# Patient Record
Sex: Female | Born: 1975 | Race: Black or African American | Hispanic: No | Marital: Single | Smoking: Current every day smoker
Health system: Southern US, Community
[De-identification: ages and names within clinical notes are randomized; demographics above are authoritative.]

## PROBLEM LIST (undated history)

## (undated) DIAGNOSIS — N200 Calculus of kidney: Secondary | ICD-10-CM

---

## 1998-11-10 ENCOUNTER — Encounter: Payer: Self-pay | Admitting: *Deleted

## 1998-11-10 ENCOUNTER — Ambulatory Visit (HOSPITAL_COMMUNITY): Admission: RE | Admit: 1998-11-10 | Discharge: 1998-11-10 | Payer: Self-pay | Admitting: *Deleted

## 1998-12-04 ENCOUNTER — Emergency Department (HOSPITAL_COMMUNITY): Admission: EM | Admit: 1998-12-04 | Discharge: 1998-12-04 | Payer: Self-pay | Admitting: Emergency Medicine

## 1998-12-08 ENCOUNTER — Inpatient Hospital Stay (HOSPITAL_COMMUNITY): Admission: AD | Admit: 1998-12-08 | Discharge: 1998-12-08 | Payer: Self-pay | Admitting: *Deleted

## 1999-02-10 ENCOUNTER — Inpatient Hospital Stay (HOSPITAL_COMMUNITY): Admission: AD | Admit: 1999-02-10 | Discharge: 1999-02-12 | Payer: Self-pay | Admitting: *Deleted

## 2003-03-24 ENCOUNTER — Emergency Department (HOSPITAL_COMMUNITY): Admission: EM | Admit: 2003-03-24 | Discharge: 2003-03-24 | Payer: Self-pay | Admitting: Emergency Medicine

## 2003-03-24 ENCOUNTER — Encounter: Payer: Self-pay | Admitting: Emergency Medicine

## 2011-07-21 ENCOUNTER — Ambulatory Visit (INDEPENDENT_AMBULATORY_CARE_PROVIDER_SITE_OTHER): Payer: Managed Care, Other (non HMO)

## 2011-07-21 DIAGNOSIS — K089 Disorder of teeth and supporting structures, unspecified: Secondary | ICD-10-CM

## 2011-07-21 DIAGNOSIS — Z0000 Encounter for general adult medical examination without abnormal findings: Secondary | ICD-10-CM

## 2011-12-31 ENCOUNTER — Emergency Department (HOSPITAL_COMMUNITY)
Admission: EM | Admit: 2011-12-31 | Discharge: 2012-01-01 | Disposition: A | Discharge status: Home / Self Care | Payer: Managed Care, Other (non HMO) | Attending: Emergency Medicine | Admitting: Emergency Medicine

## 2011-12-31 ENCOUNTER — Encounter (HOSPITAL_COMMUNITY): Payer: Self-pay | Admitting: *Deleted

## 2011-12-31 DIAGNOSIS — R109 Unspecified abdominal pain: Secondary | ICD-10-CM | POA: Insufficient documentation

## 2011-12-31 DIAGNOSIS — N200 Calculus of kidney: Secondary | ICD-10-CM

## 2011-12-31 DIAGNOSIS — N201 Calculus of ureter: Secondary | ICD-10-CM | POA: Insufficient documentation

## 2011-12-31 NOTE — ED Notes (Signed)
Pt states she has right lower quadrant pain   10/10  States it woke her up at 4 am   3 days ago,  Had a bm that was loose and today her bm was normal for her.  Denies fever or any known injury

## 2011-12-31 NOTE — ED Notes (Signed)
Pt c/o pain to R side radiating to R flank onset 4 am Thursday morning. Urine sample obtained

## 2012-01-01 ENCOUNTER — Emergency Department (HOSPITAL_COMMUNITY): Payer: Managed Care, Other (non HMO)

## 2012-01-01 LAB — BASIC METABOLIC PANEL
BUN: 12 mg/dL (ref 6–23)
CO2: 23 mEq/L (ref 19–32)
Glucose, Bld: 116 mg/dL — ABNORMAL HIGH (ref 70–99)
Potassium: 3.9 mEq/L (ref 3.5–5.1)
Sodium: 134 mEq/L — ABNORMAL LOW (ref 135–145)

## 2012-01-01 LAB — URINALYSIS, ROUTINE W REFLEX MICROSCOPIC
Bilirubin Urine: NEGATIVE
Glucose, UA: NEGATIVE mg/dL
Ketones, ur: NEGATIVE mg/dL
Nitrite: NEGATIVE
Protein, ur: 30 mg/dL — AB
Specific Gravity, Urine: 1.02 (ref 1.005–1.030)
Urobilinogen, UA: 0.2 mg/dL (ref 0.0–1.0)
pH: 5 (ref 5.0–8.0)

## 2012-01-01 LAB — URINE MICROSCOPIC-ADD ON

## 2012-01-01 LAB — PREGNANCY, URINE: Preg Test, Ur: NEGATIVE

## 2012-01-01 LAB — CBC WITH DIFFERENTIAL/PLATELET
Hemoglobin: 12.3 g/dL (ref 12.0–15.0)
Lymphocytes Relative: 13 % (ref 12–46)
Lymphs Abs: 1.7 10*3/uL (ref 0.7–4.0)
MCV: 87.3 fL (ref 78.0–100.0)
Monocytes Relative: 4 % (ref 3–12)
Neutrophils Relative %: 83 % — ABNORMAL HIGH (ref 43–77)
Platelets: 274 10*3/uL (ref 150–400)
RBC: 4.26 MIL/uL (ref 3.87–5.11)
WBC: 13.8 10*3/uL — ABNORMAL HIGH (ref 4.0–10.5)

## 2012-01-01 MED ORDER — HYDROCODONE-ACETAMINOPHEN 5-500 MG PO TABS: 1.0000 | ORAL_TABLET | Freq: Four times a day (QID) | ORAL | Status: AC | PRN

## 2012-01-01 MED ORDER — SODIUM CHLORIDE 0.9 % IV BOLUS (SEPSIS)
1000.0000 mL | Freq: Once | INTRAVENOUS | Status: AC
  Administered 2012-01-01: 1000 mL via INTRAVENOUS

## 2012-01-01 MED ORDER — KETOROLAC TROMETHAMINE 30 MG/ML IJ SOLN
30.0000 mg | Freq: Once | INTRAMUSCULAR | Status: AC
  Administered 2012-01-01: 30 mg via INTRAVENOUS
  Filled 2012-01-01: qty 1

## 2012-01-01 MED ORDER — TAMSULOSIN HCL 0.4 MG PO CAPS: 0.4000 mg | ORAL_CAPSULE | Freq: Two times a day (BID) | ORAL | Status: DC

## 2012-01-01 MED ORDER — HYDROMORPHONE HCL PF 1 MG/ML IJ SOLN
1.0000 mg | Freq: Once | INTRAMUSCULAR | Status: AC
  Administered 2012-01-01: 1 mg via INTRAVENOUS
  Filled 2012-01-01 (×2): qty 1

## 2012-01-01 MED ORDER — IBUPROFEN 800 MG PO TABS: 800.0000 mg | ORAL_TABLET | Freq: Three times a day (TID) | ORAL | Status: AC

## 2012-01-01 MED ORDER — ONDANSETRON HCL 4 MG/2ML IJ SOLN
4.0000 mg | Freq: Once | INTRAMUSCULAR | Status: AC
  Administered 2012-01-01: 4 mg via INTRAVENOUS
  Filled 2012-01-01: qty 2

## 2012-01-01 MED ORDER — PROMETHAZINE HCL 25 MG PO TABS: 25.0000 mg | ORAL_TABLET | Freq: Four times a day (QID) | ORAL | Status: DC | PRN

## 2012-01-01 NOTE — ED Notes (Signed)
Dr. Miller @ bedside.

## 2012-01-01 NOTE — ED Provider Notes (Signed)
History     CSN: [id number]  Arrival date & time 12/31/11  2300   First MD Initiated Contact with Patient 01/01/12 0057      Chief Complaint  Patient presents with  . Abdominal Pain    right lower quadrant    (Consider location/radiation/quality/duration/timing/severity/associated sxs/prior treatment) HPI Comments: 48 her old female with no abdominal surgical history presents with right-sided pain which has been ongoing intermittently for the last several days. This pain is currently 10 out of 10, severe, associated with nausea and hematuria. Nothing seems to make this better or worse, she has been unable to eat anything this evening because of the pain. It has been constant for the last 4 hours. It is poorly described quality. She has no history of kidney stones, denies fevers chills diarrhea, swelling, rashes.  Patient is a 36 y.o. female presenting with abdominal pain. The history is provided by the patient and a parent.  Abdominal Pain The primary symptoms of the illness include abdominal pain.    History reviewed. No pertinent past medical history.  History reviewed. No pertinent past surgical history.  History reviewed. No pertinent family history.  History  Substance Use Topics  . Smoking status: Not on file  . Smokeless tobacco: Not on file  . Alcohol Use: No    OB History    Grav Para Term Preterm Abortions TAB SAB Ect Mult Living                  Review of Systems  Gastrointestinal: Positive for abdominal pain.  All other systems reviewed and are negative.    Allergies  Review of patient's allergies indicates no known allergies.  Home Medications   Current Outpatient Rx  Name Route Sig Dispense Refill  . HYDROCODONE-ACETAMINOPHEN 5-500 MG PO TABS Oral Take 1-2 tablets by mouth every 6 (six) hours as needed for pain. 15 tablet 0  . IBUPROFEN 800 MG PO TABS Oral Take 1 tablet (800 mg total) by mouth 3 (three) times daily. 21 tablet 0  . PROMETHAZINE HCL  25 MG PO TABS Oral Take 1 tablet (25 mg total) by mouth every 6 (six) hours as needed for nausea. 12 tablet 0  . TAMSULOSIN HCL 0.4 MG PO CAPS Oral Take 1 capsule (0.4 mg total) by mouth 2 (two) times daily. 10 capsule 0    BP 136/70  Pulse 88  Temp 98.3 F (36.8 C) (Oral)  Resp 16  SpO2 99%  LMP 12/31/2011  Physical Exam  Nursing note and vitals reviewed. Constitutional: She appears well-developed and well-nourished. No distress.  HENT:  Head: Normocephalic and atraumatic.  Mouth/Throat: Oropharynx is clear and moist. No oropharyngeal exudate.  Eyes: Conjunctivae and EOM are normal. Pupils are equal, round, and reactive to light. Right eye exhibits no discharge. Left eye exhibits no discharge. No scleral icterus.  Neck: Normal range of motion. Neck supple. No JVD present. No thyromegaly present.  Cardiovascular: Normal rate, regular rhythm, normal heart sounds and intact distal pulses.  Exam reveals no gallop and no friction rub.   No murmur heard. Pulmonary/Chest: Effort normal and breath sounds normal. No respiratory distress. She has no wheezes. She has no rales.  Abdominal: Soft. Bowel sounds are normal. She exhibits no distension and no mass. There is no tenderness.       No CVA tenderness, no tenderness to palpation of the abdomen or the side  Musculoskeletal: Normal range of motion. She exhibits no edema and no tenderness.  Lymphadenopathy:  She has no cervical adenopathy.  Neurological: She is alert. Coordination normal.  Skin: Skin is warm and dry. No rash noted. No erythema.  Psychiatric: She has a normal mood and affect. Her behavior is normal.    ED Course  Procedures (including critical care time)  Labs Reviewed  URINALYSIS, ROUTINE W REFLEX MICROSCOPIC - Abnormal; Notable for the following:    APPearance TURBID (*)     Hgb urine dipstick LARGE (*)     Protein, ur 30 (*)     Leukocytes, UA SMALL (*)     All other components within normal limits  URINE  MICROSCOPIC-ADD ON - Abnormal; Notable for the following:    Bacteria, UA MANY (*)     Crystals CA OXALATE CRYSTALS (*)     All other components within normal limits  CBC WITH DIFFERENTIAL - Abnormal; Notable for the following:    WBC 13.8 (*)     Neutrophils Relative 83 (*)     Neutro Abs 11.5 (*)     All other components within normal limits  BASIC METABOLIC PANEL - Abnormal; Notable for the following:    Sodium 134 (*)     Glucose, Bld 116 (*)     GFR calc non Af Amer 77 (*)     GFR calc Af Amer 89 (*)     All other components within normal limits  PREGNANCY, URINE   Ct Abdomen Pelvis Wo Contrast  01/01/2012  *RADIOLOGY REPORT*  Clinical Data: Right-sided flank pain.  CT ABDOMEN AND PELVIS WITHOUT CONTRAST  Technique:  Multidetector CT imaging of the abdomen and pelvis was performed following the standard protocol without intravenous contrast.  Comparison: None.  Findings:  Renal:  There is a renal edema and hydronephrosis on the right. There is hydroureter on the right.  This secondary to an obstructing calculus within the mid right ureter measuring 4 mm (image 53).  This calculus is at the level of the L5-S1 disc space and is evident on the CT scout.  There is no nephrolithiasis.  No left ureterolithiasis.  Lung bases are clear.  No pericardial fluid.  No focal hepatic lesion.  The gallbladder, pancreas, spleen, adrenal glands are normal.  The stomach, small bowel, appendix, and colon are normal.  No free fluid the pelvis.  The uterus and ovaries and bladder normal.  Uterus is enlarged.  There is  an exophytic mass extending from the anterior margin of the uterine body which likely represents a serosal or intramural leiomyoma measuring 6.7 x 4.1 x 6.2 cm  IMPRESSION:  1.  Obstructing calculus within the mid right ureter with mild to moderate hydronephrosis on the right. 2.  Probable fibroid  uterus.  Original Report Authenticated By: Genevive Bi, M.D.     1. Kidney stone       MDM    Patient has symptoms that are consistent with a kidney stone or other internal problem. She has no reproducible tenderness, normal vital signs but the intermittent nature of her pain, calcium oxalate in her urine with hematuria suggest kidney stone, CT scan, renal function, pain medication nausea medicines and fluids.   CT scan confirms right-sided mid ureteral 4 mm urinary calculus, urinalysis shows hematuria, negative nitrites, renal function preserved, blood counts slightly elevated at 13,800. The signs were discussed with the patient and family members, she feels much better after medicines and is amenable to followup.  Discharge Prescriptions include:  Ibuprofen vicodin flomax Phenergan      Oris Drone  Hyacinth Meeker, MD 01/01/12 [phone]

## 2012-01-20 ENCOUNTER — Ambulatory Visit (INDEPENDENT_AMBULATORY_CARE_PROVIDER_SITE_OTHER): Payer: Managed Care, Other (non HMO) | Admitting: Physician Assistant

## 2012-01-20 VITALS — BP 138/78 | HR 107 | Temp 98.8°F | Resp 17 | Ht 66.0 in | Wt 181.0 lb

## 2012-01-20 DIAGNOSIS — R300 Dysuria: Secondary | ICD-10-CM

## 2012-01-20 DIAGNOSIS — R350 Frequency of micturition: Secondary | ICD-10-CM

## 2012-01-20 DIAGNOSIS — B9689 Other specified bacterial agents as the cause of diseases classified elsewhere: Secondary | ICD-10-CM

## 2012-01-20 DIAGNOSIS — N760 Acute vaginitis: Secondary | ICD-10-CM

## 2012-01-20 LAB — POCT UA - MICROSCOPIC ONLY

## 2012-01-20 LAB — POCT URINALYSIS DIPSTICK
Bilirubin, UA: NEGATIVE
Glucose, UA: NEGATIVE
Ketones, UA: NEGATIVE
Spec Grav, UA: 1.02

## 2012-01-20 LAB — POCT WET PREP WITH KOH
KOH Prep POC: NEGATIVE
Trichomonas, UA: NEGATIVE
Yeast Wet Prep HPF POC: NEGATIVE

## 2012-01-20 MED ORDER — METRONIDAZOLE 500 MG PO TABS: 500.0000 mg | ORAL_TABLET | Freq: Two times a day (BID) | ORAL | Status: AC

## 2012-01-20 NOTE — Progress Notes (Signed)
  Subjective:    Patient ID: Meghan Ross, female    DOB: 1975/07/20, 36 y.o.   MRN: [id number]  HPI  Pt presents to clinic with 2 day h/o urinary frequency and dysuria with abd pressure with urination.  She had a kidney stone earlier this month but this feels different.  She has hadno F/C and no back pain. She has no vaginal symptoms and no new sexual partners.  Review of Systems  Constitutional: Negative for fever and chills.  Gastrointestinal: Positive for abdominal pain (pressure ).  Genitourinary: Positive for dysuria, urgency, frequency and hematuria. Negative for flank pain, decreased urine volume and menstrual problem.  Musculoskeletal: Negative for back pain.       Objective:   Physical Exam  Constitutional: She is oriented to person, place, and time. She appears well-developed and well-nourished.  HENT:  Head: Normocephalic and atraumatic.  Right Ear: External ear normal.  Left Ear: External ear normal.  Nose: Nose normal.  Eyes: Conjunctivae are normal.  Neck: Normal range of motion.  Abdominal: Soft. Bowel sounds are normal. She exhibits no distension. There is tenderness (suprapubic). There is no guarding.       No CVA tenderness   Neurological: She is alert and oriented to person, place, and time.  Skin: Skin is warm and dry.  Psychiatric: She has a normal mood and affect. Her behavior is normal. Judgment and thought content normal.   Results for orders placed in visit on 01/20/12  POCT URINALYSIS DIPSTICK      Component Value Range   Color, UA yellow     Clarity, UA clear     Glucose, UA neg     Bilirubin, UA neg     Ketones, UA neg     Spec Grav, UA 1.020     Blood, UA moderate     pH, UA 5.5     Protein, UA neg     Urobilinogen, UA 0.2     Nitrite, UA neg     Leukocytes, UA Negative    POCT UA - MICROSCOPIC ONLY      Component Value Range   WBC, Ur, HPF, POC 1-4     RBC, urine, microscopic 4-6     Bacteria, U Microscopic small     Mucus, UA  trace     Epithelial cells, urine per micros 2-4     Crystals, Ur, HPF, POC neg     Casts, Ur, LPF, POC neg     Yeast, UA neg    POCT WET PREP WITH KOH      Component Value Range   Trichomonas, UA Negative     Clue Cells Wet Prep HPF POC 4-6     Epithelial Wet Prep HPF POC 8-10     Yeast Wet Prep HPF POC neg     Bacteria Wet Prep HPF POC 3+     RBC Wet Prep HPF POC 3-5     WBC Wet Prep HPF POC 12-18     KOH Prep POC Negative          Assessment & Plan:   1. Dysuria  POCT urinalysis dipstick, POCT UA - Microscopic Only, Urine culture  2. Frequency of urination  POCT urinalysis dipstick, POCT UA - Microscopic Only, POCT Wet Prep with KOH  3. BV (bacterial vaginosis)  metroNIDAZOLE (FLAGYL) 500 MG tablet   Pt to push fluids.  D/w pt diagnosis and next steps - she understands and agrees.

## 2012-01-22 LAB — URINE CULTURE

## 2012-03-05 ENCOUNTER — Ambulatory Visit (INDEPENDENT_AMBULATORY_CARE_PROVIDER_SITE_OTHER): Payer: Managed Care, Other (non HMO) | Admitting: Family Medicine

## 2012-03-05 ENCOUNTER — Ambulatory Visit: Payer: Managed Care, Other (non HMO)

## 2012-03-05 VITALS — BP 126/82 | HR 104 | Temp 98.5°F | Resp 14 | Ht 66.25 in | Wt 175.0 lb

## 2012-03-05 DIAGNOSIS — J029 Acute pharyngitis, unspecified: Secondary | ICD-10-CM

## 2012-03-05 MED ORDER — CEFDINIR 300 MG PO CAPS: 300.0000 mg | ORAL_CAPSULE | Freq: Two times a day (BID) | ORAL | Status: AC

## 2012-03-05 NOTE — Progress Notes (Signed)
[facility] [address], [place] [address] [phone]  Date:  03/05/2012   Name:  Meghan Ross   DOB:  1976/06/15   MRN:  [id number]  PCP:  No primary provider on file.    Chief Complaint: Sore Throat, Sinusitis and Cough   History of Present Illness:  Meghan Ross is a 36 y.o. very pleasant female patient who presents with the following:  She has noted a bad sore throat, sinus congestion and right ear pain.  She had a subjective fever and sweats at night.  She has a slight cough and chest congestion.  Illness for about 2 days. She has tried alka- seltzer cold but this has not helped.  Her throat seems swollen- hurts to eat and drink.  She has some nausea, but no vomiting or diarrhea.    Her 58 year old daughter is not sick  There is no problem list on file for this patient.   No past medical history on file.  No past surgical history on file.  History  Substance Use Topics  . Smoking status: Current Everyday Smoker -- 0.3 packs/day for 17 years    Types: Cigarettes  . Smokeless tobacco: Not on file  . Alcohol Use: No    No family history on file.  No Known Allergies  Medication list has been reviewed and updated.  No current outpatient prescriptions on file prior to visit.    Review of Systems:  As per HPI- otherwise negative.   Physical Examination: Filed Vitals:   03/05/12 0832  BP: 126/82  Pulse: 104  Temp: 98.5 F (36.9 C)  Resp: 14   Filed Vitals:   03/05/12 0832  Height: 5' 6.25" (1.683 m)  Weight: 175 lb (79.379 kg)   Body mass index is 28.03 kg/(m^2). Ideal Body Weight: Weight in (lb) to have BMI = 25: 155.7   GEN: WDWN, NAD, Non-toxic, A & O x 3 HEENT: Atraumatic, Normocephalic. Neck supple. No masses.  TM wnl, oropharynx with redness and slight swelling right side.  Uvula midline, no visible exudate.  Shotty cervical nodes.   PEERL, EOMI Ears and Nose: No external deformity. CV: RRR, No M/G/R. No JVD.  No thrill. No extra heart sounds. PULM: CTA B, no wheezes, crackles, rhonchi. No retractions. No resp. distress. No accessory muscle use. ABD: S, NT, ND EXTR: No c/c/e NEURO Normal gait.  PSYCH: Normally interactive. Conversant. Not depressed or anxious appearing.  Calm demeanor.   Results for orders placed in visit on 03/05/12  POCT RAPID STREP A (OFFICE)      Component Value Range   Rapid Strep A Screen Negative  Negative    Assessment and Plan: 1. Sore throat  POCT rapid strep A, Culture, Group A Strep, cefdinir (OMNICEF) 300 MG capsule   Sore throat, suspect that she might have strep.  Will cover with omnicef while we await throat culture.  Note to be OOW until Wednesday. Let me know if not better within the next couple of days- Sooner if worse.     Abbe Amsterdam, MD

## 2012-03-07 LAB — CULTURE, GROUP A STREP

## 2013-11-23 IMAGING — CT CT ABD-PELV W/O CM
1 of 2 series · 15 of 32 positions shown, 19 images · non-contrast
Comparison: None.

CLINICAL DATA: Right-sided flank pain.

CT ABDOMEN AND PELVIS WITHOUT CONTRAST
TECHNIQUE: Multidetector CT imaging of the abdomen and pelvis was
performed following the standard protocol without intravenous
contrast.

[Series 2: abd/pel w/o · axial · non-contrast · 0.72mm/px · z∈[-423,-43]mm · 15 of 84 slices shown, 19 images]
[im 4/84  soft-tissue]
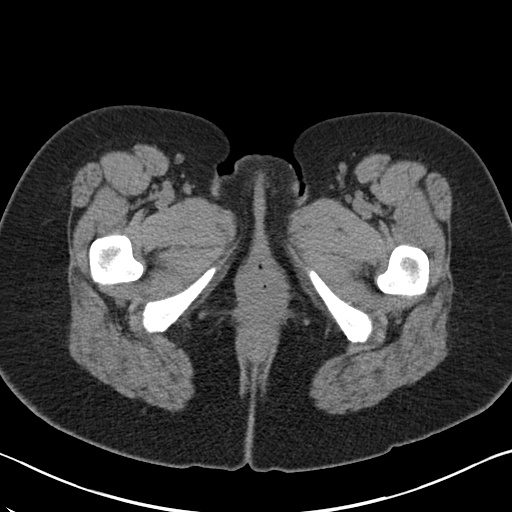
[im 4/84  bone]
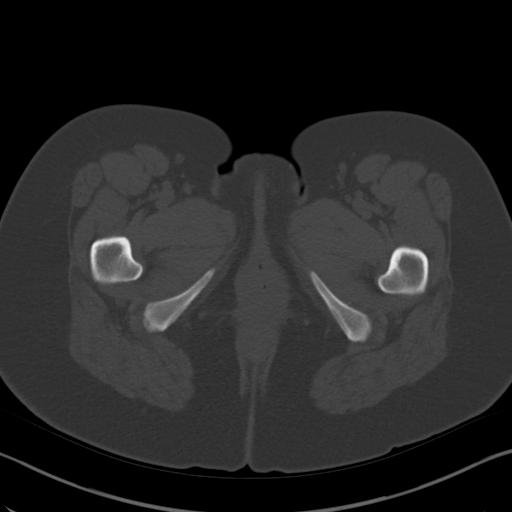
[im 10/84  soft-tissue]
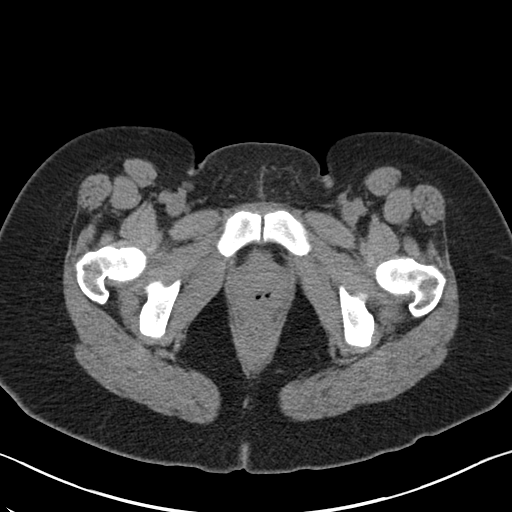
[im 16/84  soft-tissue]
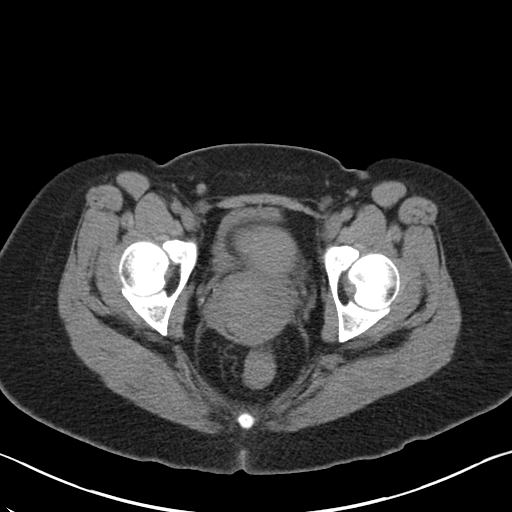
[im 23/84  soft-tissue]
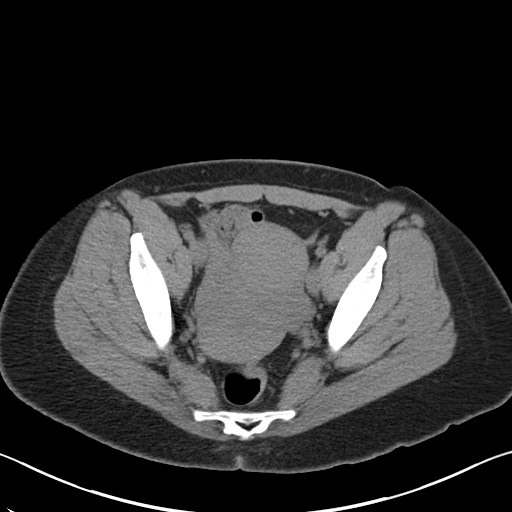
[im 29/84  soft-tissue]
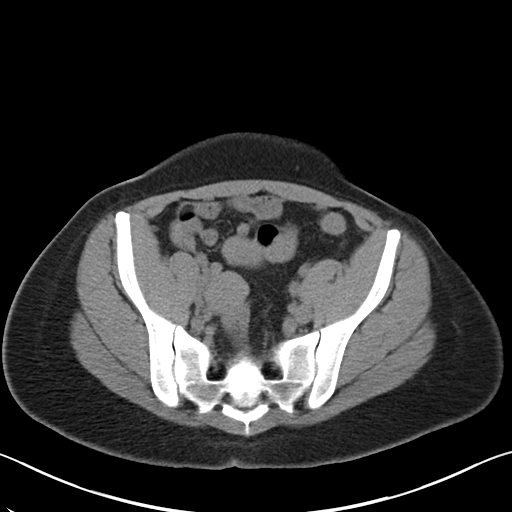
[im 36/84  soft-tissue]
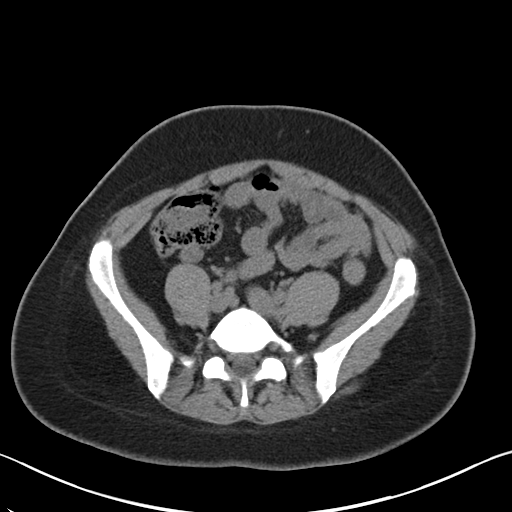
[im 42/84  soft-tissue]
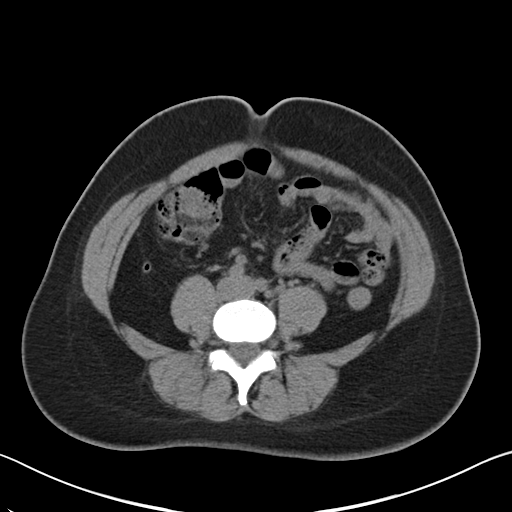
[im 48/84  soft-tissue]
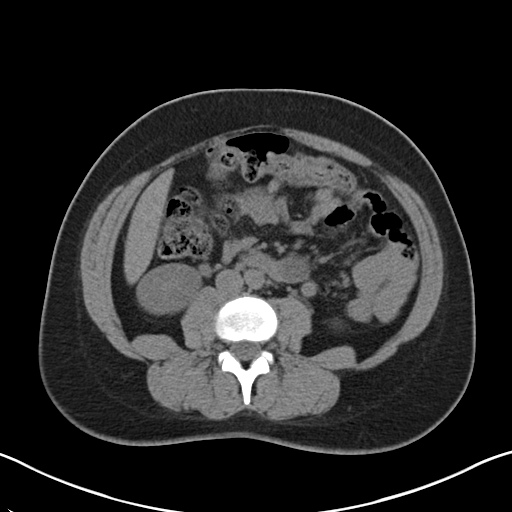
[im 55/84  soft-tissue]
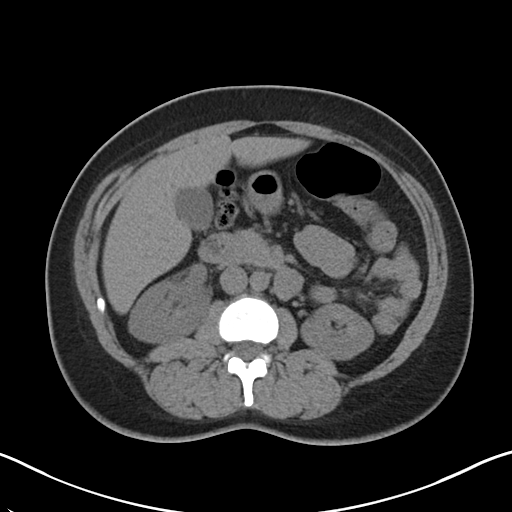
[im 55/84  bone]
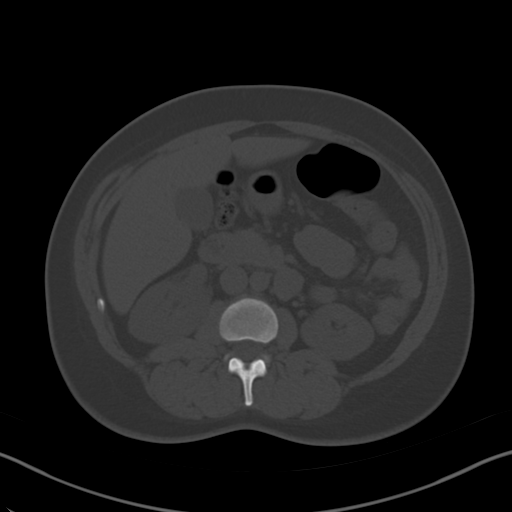
[im 61/84  soft-tissue]
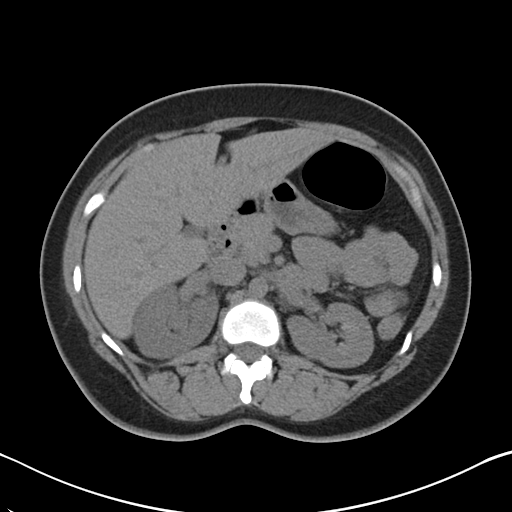
[im 68/84  soft-tissue]
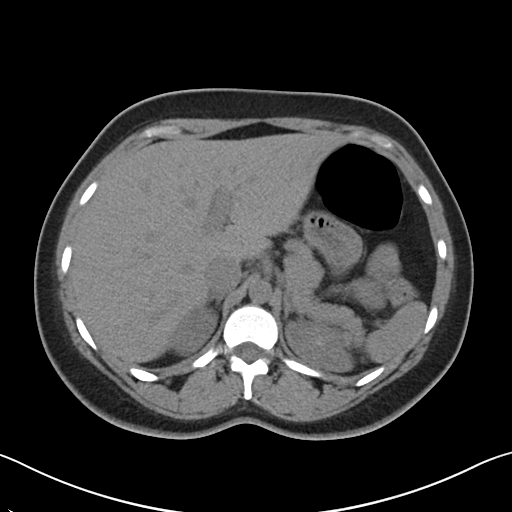
[im 71/84  lung]
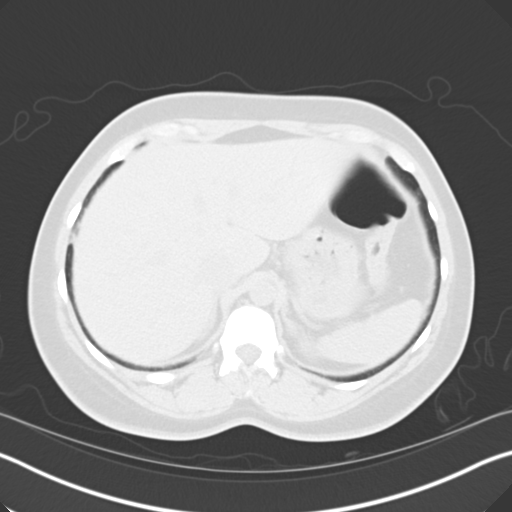
[im 74/84  soft-tissue]
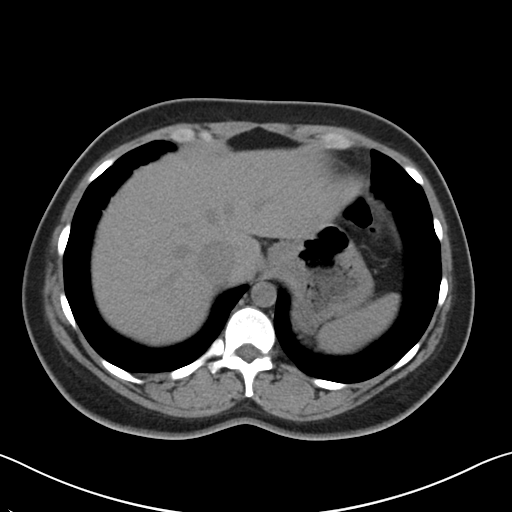
[im 74/84  lung]
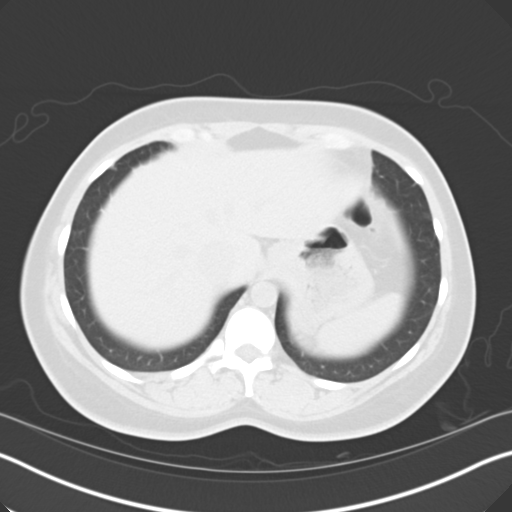
[im 77/84  lung]
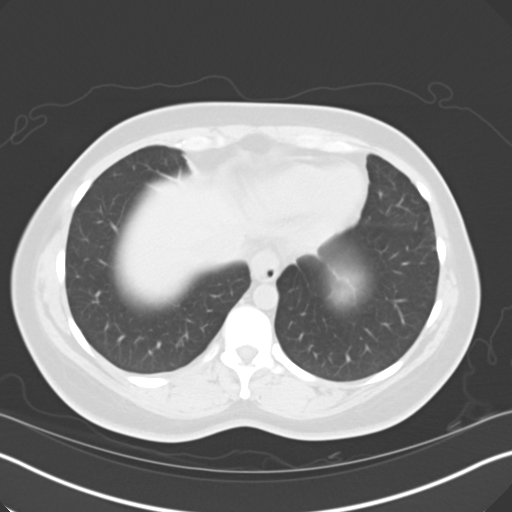
[im 80/84  soft-tissue]
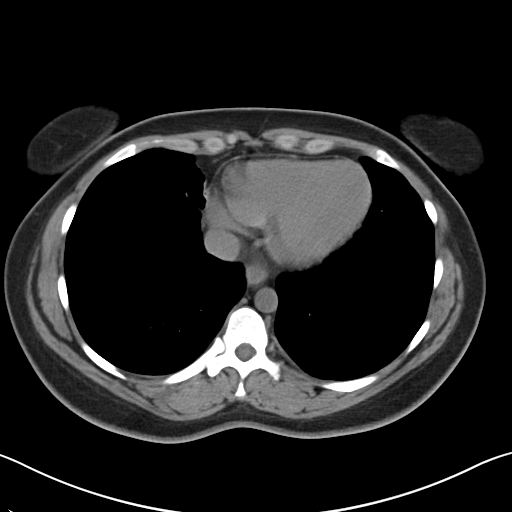
[im 80/84  lung]
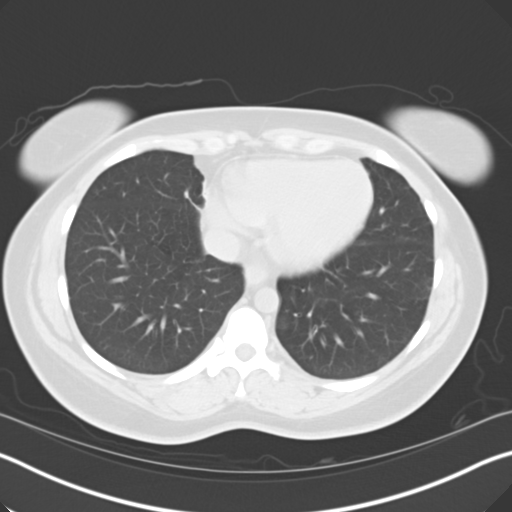

[15 of 32 positions shown; findings below may reference images not displayed]

FINDINGS: Renal:  There is a renal edema and hydronephrosis on the right.
There is hydroureter on the right.  This secondary to an
obstructing calculus within the mid right ureter measuring 4 mm
(image 53).  This calculus is at the level of the L5-S1 disc space
and is evident on the CT scout.  There is no nephrolithiasis.  No
left ureterolithiasis.

Lung bases are clear.  No pericardial fluid.  No focal hepatic
lesion.  The gallbladder, pancreas, spleen, adrenal glands are
normal.  The stomach, small bowel, appendix, and colon are normal.

No free fluid the pelvis.  The uterus and ovaries and bladder
normal.  Uterus is enlarged.  There is  an exophytic mass extending
from the anterior margin of the uterine body which likely
represents a serosal or intramural leiomyoma measuring 6.7 x 4.1 x
6.2 cm
IMPRESSION: 1..  Obstructing calculus within the mid right ureter with mild to
moderate hydronephrosis on the right.
2.  Probable fibroid  uterus.

## 2014-03-22 ENCOUNTER — Encounter (HOSPITAL_COMMUNITY): Payer: Self-pay | Admitting: Emergency Medicine

## 2014-03-22 ENCOUNTER — Emergency Department (HOSPITAL_COMMUNITY): Payer: Managed Care, Other (non HMO)

## 2014-03-22 ENCOUNTER — Emergency Department (HOSPITAL_COMMUNITY)
Admission: EM | Admit: 2014-03-22 | Discharge: 2014-03-22 | Disposition: A | Discharge status: Home / Self Care | Payer: Managed Care, Other (non HMO) | Attending: Emergency Medicine | Admitting: Emergency Medicine

## 2014-03-22 DIAGNOSIS — K5900 Constipation, unspecified: Secondary | ICD-10-CM | POA: Diagnosis present

## 2014-03-22 DIAGNOSIS — R000 Tachycardia, unspecified: Secondary | ICD-10-CM | POA: Insufficient documentation

## 2014-03-22 DIAGNOSIS — F17200 Nicotine dependence, unspecified, uncomplicated: Secondary | ICD-10-CM | POA: Insufficient documentation

## 2014-03-22 DIAGNOSIS — Z3202 Encounter for pregnancy test, result negative: Secondary | ICD-10-CM | POA: Diagnosis not present

## 2014-03-22 DIAGNOSIS — K5909 Other constipation: Secondary | ICD-10-CM | POA: Diagnosis not present

## 2014-03-22 DIAGNOSIS — Z792 Long term (current) use of antibiotics: Secondary | ICD-10-CM | POA: Insufficient documentation

## 2014-03-22 LAB — CBC WITH DIFFERENTIAL/PLATELET
BASOS PCT: 0 % (ref 0–1)
Basophils Absolute: 0 10*3/uL (ref 0.0–0.1)
EOS ABS: 0 10*3/uL (ref 0.0–0.7)
EOS PCT: 0 % (ref 0–5)
HCT: 38.8 % (ref 36.0–46.0)
HEMOGLOBIN: 12.8 g/dL (ref 12.0–15.0)
LYMPHS ABS: 2.4 10*3/uL (ref 0.7–4.0)
Lymphocytes Relative: 20 % (ref 12–46)
MCH: 28.4 pg (ref 26.0–34.0)
MCHC: 33 g/dL (ref 30.0–36.0)
MCV: 86.2 fL (ref 78.0–100.0)
MONOS PCT: 7 % (ref 3–12)
Monocytes Absolute: 0.9 10*3/uL (ref 0.1–1.0)
NEUTROS PCT: 73 % (ref 43–77)
Neutro Abs: 8.6 10*3/uL — ABNORMAL HIGH (ref 1.7–7.7)
PLATELETS: 278 10*3/uL (ref 150–400)
RBC: 4.5 MIL/uL (ref 3.87–5.11)
RDW: 14.6 % (ref 11.5–15.5)
WBC: 11.9 10*3/uL — ABNORMAL HIGH (ref 4.0–10.5)

## 2014-03-22 LAB — URINE MICROSCOPIC-ADD ON

## 2014-03-22 LAB — COMPREHENSIVE METABOLIC PANEL
ALBUMIN: 3.9 g/dL (ref 3.5–5.2)
ALK PHOS: 74 U/L (ref 39–117)
ALT: 14 U/L (ref 0–35)
ANION GAP: 13 (ref 5–15)
AST: 15 U/L (ref 0–37)
BUN: 13 mg/dL (ref 6–23)
CALCIUM: 9.4 mg/dL (ref 8.4–10.5)
CO2: 22 mEq/L (ref 19–32)
Chloride: 101 mEq/L (ref 96–112)
Creatinine, Ser: 0.91 mg/dL (ref 0.50–1.10)
GFR calc non Af Amer: 79 mL/min — ABNORMAL LOW (ref >90)
GLUCOSE: 102 mg/dL — AB (ref 70–99)
POTASSIUM: 4.1 meq/L (ref 3.7–5.3)
Sodium: 136 mEq/L — ABNORMAL LOW (ref 137–147)
TOTAL PROTEIN: 7.7 g/dL (ref 6.0–8.3)
Total Bilirubin: 0.3 mg/dL (ref 0.3–1.2)

## 2014-03-22 LAB — URINALYSIS, ROUTINE W REFLEX MICROSCOPIC
Bilirubin Urine: NEGATIVE
Glucose, UA: NEGATIVE mg/dL
Ketones, ur: 15 mg/dL — AB
NITRITE: NEGATIVE
PROTEIN: NEGATIVE mg/dL
SPECIFIC GRAVITY, URINE: 1.025 (ref 1.005–1.030)
UROBILINOGEN UA: 0.2 mg/dL (ref 0.0–1.0)
pH: 5.5 (ref 5.0–8.0)

## 2014-03-22 LAB — POC URINE PREG, ED: Preg Test, Ur: NEGATIVE

## 2014-03-22 LAB — I-STAT CG4 LACTIC ACID, ED: LACTIC ACID, VENOUS: 1.34 mmol/L (ref 0.5–2.2)

## 2014-03-22 LAB — LIPASE, BLOOD: LIPASE: 19 U/L (ref 11–59)

## 2014-03-22 LAB — TROPONIN I

## 2014-03-22 MED ORDER — SODIUM CHLORIDE 0.9 % IV BOLUS (SEPSIS)
1000.0000 mL | Freq: Once | INTRAVENOUS | Status: AC
  Administered 2014-03-22: 1000 mL via INTRAVENOUS

## 2014-03-22 MED ORDER — MAGNESIUM CITRATE PO SOLN: 1.0000 | Freq: Once | ORAL | Status: DC

## 2014-03-22 MED ORDER — MILK AND MOLASSES ENEMA
1.0000 | Freq: Once | RECTAL | Status: AC
  Administered 2014-03-22: 250 mL via RECTAL
  Filled 2014-03-22: qty 250

## 2014-03-22 MED ORDER — LACTULOSE 10 GM/15ML PO SOLN: 30.0000 g | Freq: Two times a day (BID) | ORAL | Status: DC | PRN

## 2014-03-22 NOTE — Discharge Instructions (Signed)

## 2014-03-22 NOTE — ED Provider Notes (Signed)
CSN: [id number]     Arrival date & time 03/22/14  4098 History   First MD Initiated Contact with Patient 03/22/14 [phone]     Chief Complaint  Patient presents with  . Constipation     (Consider location/radiation/quality/duration/timing/severity/associated sxs/prior Treatment) HPI Comments: Patient presents to the ER for evaluation of constipation. Patient reports that she has rectal pain and fullness, feels like she has to have a bowel movement but cannot. She reports a history of recurrent constipation the past. She recently had a dental procedure, is taking Tylenol with Codeine for pain control.  Patient reports that she did have a normal bowel movement yesterday. Symptoms developed yesterday. She took a laxative last night without any improvement. She tried a fleets enema this morning, did not have a bowel movement. The patient denies fever, nausea, vomiting.  Patient is a 38 y.o. female presenting with constipation.  Constipation   History reviewed. No pertinent past medical history. History reviewed. No pertinent past surgical history. History reviewed. No pertinent family history. History  Substance Use Topics  . Smoking status: Current Every Day Smoker -- 0.30 packs/day for 17 years    Types: Cigarettes  . Smokeless tobacco: Not on file  . Alcohol Use: No   OB History   Grav Para Term Preterm Abortions TAB SAB Ect Mult Living                 Review of Systems  Respiratory: Negative for shortness of breath.   Cardiovascular: Negative for chest pain.  Gastrointestinal: Positive for constipation and rectal pain.  Psychiatric/Behavioral: The patient is nervous/anxious.   All other systems reviewed and are negative.     Allergies  Review of patient's allergies indicates no known allergies.  Home Medications   Prior to Admission medications   Medication Sig Start Date End Date Taking? Authorizing Provider  Acetaminophen-Codeine (TYLENOL/CODEINE #3) 300-30 MG per  tablet Take 0.5-1 tablets by mouth every 4 (four) hours as needed for pain.   Yes Historical Provider, MD  amoxicillin (AMOXIL) 250 MG capsule Take 250 mg by mouth 3 (three) times daily. For 10 days 03/18/14  Yes Historical Provider, MD   BP 132/79  Pulse 110  Temp(Src) 98.3 F (36.8 C) (Oral)  Resp 16  SpO2 100%  LMP 02/28/2014 Physical Exam  Constitutional: She is oriented to person, place, and time. She appears well-developed and well-nourished. No distress.  HENT:  Head: Normocephalic and atraumatic.  Right Ear: Hearing normal.  Left Ear: Hearing normal.  Nose: Nose normal.  Mouth/Throat: Oropharynx is clear and moist and mucous membranes are normal.  Eyes: Conjunctivae and EOM are normal. Pupils are equal, round, and reactive to light.  Neck: Normal range of motion. Neck supple.  Cardiovascular: Regular rhythm, S1 normal and S2 normal.  Tachycardia present.  Exam reveals no gallop and no friction rub.   No murmur heard. Pulmonary/Chest: Effort normal and breath sounds normal. No respiratory distress. She exhibits no tenderness.  Abdominal: Soft. Normal appearance and bowel sounds are normal. There is no hepatosplenomegaly. There is no tenderness. There is no rebound, no guarding, no tenderness at McBurney's point and negative Murphy's sign. No hernia.  Genitourinary:  Large amount of soft stool in rectum  Musculoskeletal: Normal range of motion.  Neurological: She is alert and oriented to person, place, and time. She has normal strength. No cranial nerve deficit or sensory deficit. Coordination normal. GCS eye subscore is 4. GCS verbal subscore is 5. GCS motor subscore is 6.  Skin: Skin is warm, dry and intact. No rash noted. No cyanosis.  Psychiatric: She has a normal mood and affect. Her speech is normal and behavior is normal. Thought content normal.    ED Course  Procedures (including critical care time) Labs Review Labs Reviewed  CBC WITH DIFFERENTIAL - Abnormal; Notable  for the following:    WBC 11.9 (*)    Neutro Abs 8.6 (*)    All other components within normal limits  COMPREHENSIVE METABOLIC PANEL - Abnormal; Notable for the following:    Sodium 136 (*)    Glucose, Bld 102 (*)    GFR calc non Af Amer 79 (*)    All other components within normal limits  URINALYSIS, ROUTINE W REFLEX MICROSCOPIC - Abnormal; Notable for the following:    APPearance CLOUDY (*)    Hgb urine dipstick TRACE (*)    Ketones, ur 15 (*)    Leukocytes, UA MODERATE (*)    All other components within normal limits  URINE MICROSCOPIC-ADD ON - Abnormal; Notable for the following:    Squamous Epithelial / LPF MANY (*)    Bacteria, UA FEW (*)    All other components within normal limits  LIPASE, BLOOD  TROPONIN I  I-STAT CG4 LACTIC ACID, ED  POC URINE PREG, ED    Imaging Review Dg Abd Acute W/chest  03/22/2014   CLINICAL DATA:  History smoking.  Constipation.  EXAM: ACUTE ABDOMEN SERIES (ABDOMEN 2 VIEW & CHEST 1 VIEW)  COMPARISON:  CT 01/01/2012.  FINDINGS: Mediastinum and hilar structures normal. Lungs are clear. Heart size normal. No pleural effusion or pneumothorax.  Soft tissue structures are unremarkable. The gas pattern is nonspecific. Large amount stool noted throughout the colon suggesting constipation. Pelvic phleboliths. No acute bony abnormality.  IMPRESSION: Large amount of stool noted throughout the colon suggesting constipation. No bowel distention. No acute cardiopulmonary disease.   Electronically Signed   By: Maisie Fus  Register   On: 03/22/2014 11:44     EKG Interpretation   Date/Time:  Saturday March 22 2014 08:43:52 EDT Ventricular Rate:  105 PR Interval:  165 QRS Duration: 91 QT Interval:  329 QTC Calculation: 435 R Axis:   88 Text Interpretation:  Sinus tachycardia Right atrial enlargement Confirmed  by Tenea Sens  MD, Wilene Pharo ([id number]) on 03/22/2014 8:47:00 AM      MDM   Final diagnoses:  None   constipation  Patient presents to the ER for  evaluation of constipation. Patient reports rectal pain and fullness. Rectal exam revealed a large amount of stool in the rectal vault, but not impacted. Stool is soft and I could not remove it digitally. The patient was given a milk and molasses enema with bowel movement. Workup was otherwise unremarkable. Patient is feeling much better now. Will discharge, lactulose, increase fiber.    Gilda Crease, MD 03/22/14 [phone]

## 2014-03-22 NOTE — ED Notes (Signed)
She c/o constipation with feeling of "fullness" in rectum.  She tried Fleets this morning with limited results.  She is in no distress.

## 2014-03-22 NOTE — ED Notes (Signed)
Patient is aware that we need a urine specimen, does not feel that she is able to urinate at this time. Patient will let us know when she is able.

## 2017-07-26 ENCOUNTER — Other Ambulatory Visit: Payer: Self-pay

## 2017-07-26 ENCOUNTER — Encounter (HOSPITAL_BASED_OUTPATIENT_CLINIC_OR_DEPARTMENT_OTHER): Payer: Self-pay

## 2017-07-26 ENCOUNTER — Emergency Department (HOSPITAL_BASED_OUTPATIENT_CLINIC_OR_DEPARTMENT_OTHER)
Admission: EM | Admit: 2017-07-26 | Discharge: 2017-07-26 | Disposition: A | Discharge status: Home / Self Care | Payer: BLUE CROSS/BLUE SHIELD | Attending: Emergency Medicine | Admitting: Emergency Medicine

## 2017-07-26 ENCOUNTER — Emergency Department (HOSPITAL_BASED_OUTPATIENT_CLINIC_OR_DEPARTMENT_OTHER): Payer: BLUE CROSS/BLUE SHIELD

## 2017-07-26 DIAGNOSIS — R197 Diarrhea, unspecified: Secondary | ICD-10-CM | POA: Diagnosis not present

## 2017-07-26 DIAGNOSIS — R109 Unspecified abdominal pain: Secondary | ICD-10-CM | POA: Insufficient documentation

## 2017-07-26 DIAGNOSIS — R110 Nausea: Secondary | ICD-10-CM | POA: Diagnosis not present

## 2017-07-26 DIAGNOSIS — F17210 Nicotine dependence, cigarettes, uncomplicated: Secondary | ICD-10-CM | POA: Insufficient documentation

## 2017-07-26 HISTORY — DX: Calculus of kidney: N20.0

## 2017-07-26 LAB — COMPREHENSIVE METABOLIC PANEL
ALT: 15 U/L (ref 14–54)
AST: 20 U/L (ref 15–41)
Albumin: 4.3 g/dL (ref 3.5–5.0)
Alkaline Phosphatase: 79 U/L (ref 38–126)
Anion gap: 9 (ref 5–15)
BUN: 16 mg/dL (ref 6–20)
CO2: 25 mmol/L (ref 22–32)
Calcium: 9.3 mg/dL (ref 8.9–10.3)
Chloride: 102 mmol/L (ref 101–111)
Creatinine, Ser: 0.9 mg/dL (ref 0.44–1.00)
GFR calc Af Amer: >60 mL/min (ref >60)
GFR calc non Af Amer: >60 mL/min (ref >60)
Glucose, Bld: 107 mg/dL — ABNORMAL HIGH (ref 65–99)
Potassium: 4.3 mmol/L (ref 3.5–5.1)
Sodium: 136 mmol/L (ref 135–145)
Total Bilirubin: 0.5 mg/dL (ref 0.3–1.2)
Total Protein: 8.3 g/dL — ABNORMAL HIGH (ref 6.5–8.1)

## 2017-07-26 LAB — CBC WITH DIFFERENTIAL/PLATELET
Basophils Absolute: 0 10*3/uL (ref 0.0–0.1)
Basophils Relative: 0 %
Eosinophils Absolute: 0 10*3/uL (ref 0.0–0.7)
Eosinophils Relative: 0 %
HCT: 40.2 % (ref 36.0–46.0)
Hemoglobin: 13 g/dL (ref 12.0–15.0)
Lymphocytes Relative: 20 %
Lymphs Abs: 3.1 10*3/uL (ref 0.7–4.0)
MCH: 29 pg (ref 26.0–34.0)
MCHC: 32.3 g/dL (ref 30.0–36.0)
MCV: 89.7 fL (ref 78.0–100.0)
Monocytes Absolute: 0.7 10*3/uL (ref 0.1–1.0)
Monocytes Relative: 4 %
Neutro Abs: 11.8 10*3/uL — ABNORMAL HIGH (ref 1.7–7.7)
Neutrophils Relative %: 76 %
Platelets: 289 10*3/uL (ref 150–400)
RBC: 4.48 MIL/uL (ref 3.87–5.11)
RDW: 15.3 % (ref 11.5–15.5)
WBC: 15.7 10*3/uL — ABNORMAL HIGH (ref 4.0–10.5)

## 2017-07-26 LAB — URINALYSIS, ROUTINE W REFLEX MICROSCOPIC
Bilirubin Urine: NEGATIVE
GLUCOSE, UA: NEGATIVE mg/dL
Ketones, ur: 15 mg/dL — AB
Leukocytes, UA: NEGATIVE
Nitrite: NEGATIVE
PROTEIN: 30 mg/dL — AB
Specific Gravity, Urine: >1.03 — ABNORMAL HIGH (ref 1.005–1.030)
pH: 5 (ref 5.0–8.0)

## 2017-07-26 LAB — URINALYSIS, MICROSCOPIC (REFLEX)

## 2017-07-26 LAB — PREGNANCY, URINE: Preg Test, Ur: NEGATIVE

## 2017-07-26 LAB — LIPASE, BLOOD: Lipase: 25 U/L (ref 11–51)

## 2017-07-26 MED ORDER — MORPHINE SULFATE (PF) 4 MG/ML IV SOLN
4.0000 mg | Freq: Once | INTRAVENOUS | Status: AC
Start: 2017-07-26 — End: 2017-07-26
  Administered 2017-07-26: 4 mg via INTRAVENOUS
  Filled 2017-07-26: qty 1

## 2017-07-26 MED ORDER — HYDROCODONE-ACETAMINOPHEN 5-325 MG PO TABS: 1.0000 | ORAL_TABLET | Freq: Four times a day (QID) | ORAL | 0 refills | Status: AC | PRN

## 2017-07-26 MED ORDER — SODIUM CHLORIDE 0.9 % IV BOLUS (SEPSIS)
500.0000 mL | Freq: Once | INTRAVENOUS | Status: AC
  Administered 2017-07-26: 500 mL via INTRAVENOUS

## 2017-07-26 MED ORDER — IBUPROFEN 800 MG PO TABS: 800.0000 mg | ORAL_TABLET | Freq: Three times a day (TID) | ORAL | 0 refills | Status: AC

## 2017-07-26 MED ORDER — ONDANSETRON HCL 4 MG PO TABS: 4.0000 mg | ORAL_TABLET | Freq: Four times a day (QID) | ORAL | 0 refills | Status: AC

## 2017-07-26 MED ORDER — ONDANSETRON HCL 4 MG/2ML IJ SOLN
4.0000 mg | Freq: Once | INTRAMUSCULAR | Status: AC
  Administered 2017-07-26: 4 mg via INTRAVENOUS
  Filled 2017-07-26: qty 2

## 2017-07-26 NOTE — Discharge Instructions (Signed)
We think you may have passed a kidney stone.  There was blood in your urine as well as amorphous crystals.  Your CT scan also showed signs of a possibly passed kidney stone.  Medications: Norco, ibuprofen, Zofran  Treatment: Take ibuprofen every 8 hours for mild to moderate pain.  Take 1-2 Norco every 4-6 hours as needed for severe pain.  Take Zofran every 6 hours as needed for nausea or vomiting.  Do not drink alcohol, drive, operate machinery or participate in any other potentially dangerous activities while taking opiate pain medication as it may make you sleepy. Do not take this medication with any other sedating medications, either prescription or over-the-counter. If you were prescribed Percocet or Vicodin, do not take these with acetaminophen (Tylenol) as it is already contained within these medications and overdose of Tylenol is dangerous.   This medication is an opiate (or narcotic) pain medication and can be habit forming.  Use it as little as possible to achieve adequate pain control.  Do not use or use it with extreme caution if you have a history of opiate abuse or dependence. This medication is intended for your use only - do not give any to anyone else and keep it in a secure place where nobody else, especially children, have access to it. It will also cause or worsen constipation, so you may want to consider taking an over-the-counter stool softener while you are taking this medication.    Follow-up: Please follow-up with urologist for further evaluation and treatment within 1 week.  Please return the emergency department if you develop any new or worsening symptoms. You will be called if your urine culture grows bacteria and requires antibiotic treatment.

## 2017-07-26 NOTE — ED Triage Notes (Signed)
C/o left flank pain x today-states feels like a kidney stone-NAD-steady gait

## 2017-07-26 NOTE — ED Notes (Signed)
ED Provider at bedside. 

## 2017-07-26 NOTE — ED Notes (Signed)
Patient transported to CT 

## 2017-07-26 NOTE — ED Provider Notes (Signed)
[facility] EMERGENCY DEPARTMENT Provider Note   CSN: [id number] Arrival date & time: 07/26/17  1948     History   Chief Complaint Chief Complaint  Patient presents with  . Flank Pain    HPI Meghan Ross is a 42 y.o. female with history of kidney stones who presents with acute onset left flank pain that began while the patient was at work today.  She has radiation around her left abdomen.  She denies urinary symptoms.  She has had associated nausea, but no vomiting.  She did have episode of diarrhea after eating.  She reports her last kidney stone was 4 years ago.  She does admit to drinking sweet tea regularly.  She denies any fevers, chest pain, shortness of breath, abnormal vaginal discharge.  Patient states she gets irregular periods as of late that she feels may be related to menopause starting.  She has no concern for STD exposure.  She took Naprosyn at home without relief.  HPI  Past Medical History:  Diagnosis Date  . Kidney stone     There are no active problems to display for this patient.   History reviewed. No pertinent surgical history.  OB History    No data available       Home Medications    Prior to Admission medications   Medication Sig Start Date End Date Taking? Authorizing Provider  HYDROcodone-acetaminophen (NORCO/VICODIN) 5-325 MG tablet Take 1-2 tablets by mouth every 6 (six) hours as needed for severe pain. 07/26/17   Vann Okerlund, Waylan Boga, PA-C  ibuprofen (ADVIL,MOTRIN) 800 MG tablet Take 1 tablet (800 mg total) by mouth 3 (three) times daily. 07/26/17   Farah Lepak, Waylan Boga, PA-C  ondansetron (ZOFRAN) 4 MG tablet Take 1 tablet (4 mg total) by mouth every 6 (six) hours. 07/26/17   Emi Holes, PA-C    Family History No family history on file.  Social History Social History   Tobacco Use  . Smoking status: Current Every Day Smoker    Packs/day: 0.30    Years: 17.00    Pack years: 5.10    Types: Cigarettes  . Smokeless tobacco:  Never Used  Substance Use Topics  . Alcohol use: No  . Drug use: No     Allergies   Patient has no known allergies.   Review of Systems Review of Systems  Constitutional: Negative for chills and fever.  HENT: Negative for facial swelling and sore throat.   Respiratory: Negative for shortness of breath.   Cardiovascular: Negative for chest pain.  Gastrointestinal: Positive for diarrhea and nausea. Negative for abdominal pain and vomiting.  Genitourinary: Positive for flank pain. Negative for dysuria, vaginal bleeding and vaginal discharge.  Musculoskeletal: Positive for back pain.  Skin: Negative for rash and wound.  Neurological: Negative for headaches.  Psychiatric/Behavioral: The patient is not nervous/anxious.      Physical Exam Updated Vital Signs BP 113/77 (BP Location: Right Arm)   Pulse 75   Temp 97.7 F (36.5 C) (Oral)   Resp 19   Ht 5\' 5"  (1.651 m)   Wt 86.6 kg (191 lb)   LMP 07/12/2017   SpO2 100%   BMI 31.78 kg/m   Physical Exam  Constitutional: She appears well-developed and well-nourished. No distress.  HENT:  Head: Normocephalic and atraumatic.  Mouth/Throat: Oropharynx is clear and moist. No oropharyngeal exudate.  Eyes: Conjunctivae are normal. Pupils are equal, round, and reactive to light. Right eye exhibits no discharge. Left eye exhibits no  discharge. No scleral icterus.  Neck: Normal range of motion. Neck supple. No thyromegaly present.  Cardiovascular: Normal rate, regular rhythm, normal heart sounds and intact distal pulses. Exam reveals no gallop and no friction rub.  No murmur heard. Pulmonary/Chest: Effort normal and breath sounds normal. No stridor. No respiratory distress. She has no wheezes. She has no rales.  Abdominal: Soft. Bowel sounds are normal. She exhibits no distension. There is tenderness. There is CVA tenderness (L). There is no rebound and no guarding.    Musculoskeletal: She exhibits no edema.  Lymphadenopathy:    She  has no cervical adenopathy.  Neurological: She is alert. Coordination normal.  Skin: Skin is warm and dry. No rash noted. She is not diaphoretic. No pallor.  Psychiatric: She has a normal mood and affect.  Nursing note and vitals reviewed.    ED Treatments / Results  Labs (all labs ordered are listed, but only abnormal results are displayed) Labs Reviewed  URINALYSIS, ROUTINE W REFLEX MICROSCOPIC - Abnormal; Notable for the following components:      Result Value   Color, Urine BROWN (*)    APPearance CLOUDY (*)    Specific Gravity, Urine >1.030 (*)    Hgb urine dipstick LARGE (*)    Ketones, ur 15 (*)    Protein, ur 30 (*)    All other components within normal limits  COMPREHENSIVE METABOLIC PANEL - Abnormal; Notable for the following components:   Glucose, Bld 107 (*)    Total Protein 8.3 (*)    All other components within normal limits  CBC WITH DIFFERENTIAL/PLATELET - Abnormal; Notable for the following components:   WBC 15.7 (*)    Neutro Abs 11.8 (*)    All other components within normal limits  URINALYSIS, MICROSCOPIC (REFLEX) - Abnormal; Notable for the following components:   Bacteria, UA FEW (*)    Squamous Epithelial / LPF 6-30 (*)    All other components within normal limits  URINE CULTURE  PREGNANCY, URINE  LIPASE, BLOOD    EKG  EKG Interpretation None       Radiology Ct Renal Stone Study  Result Date: 07/26/2017 CLINICAL DATA:  Left flank pain today. Hematuria. Previous history of renal stones. EXAM: CT ABDOMEN AND PELVIS WITHOUT CONTRAST TECHNIQUE: Multidetector CT imaging of the abdomen and pelvis was performed following the standard protocol without IV contrast. COMPARISON:  01/01/2012 FINDINGS: Lower chest: The lung bases are clear. Hepatobiliary: No focal liver abnormality is seen. No gallstones, gallbladder wall thickening, or biliary dilatation. Pancreas: Unremarkable. No pancreatic ductal dilatation or surrounding inflammatory changes. Spleen:  Normal in size without focal abnormality. Adrenals/Urinary Tract: No adrenal gland nodules. The left kidney is slightly enlarged and hypodense compared to the right. There is mild stranding around the left kidney. No hydronephrosis or hydroureter and no stones are identified. This appearance could reflect inflammatory process such as pyelonephritis or it could be the sequela of a recently passed stone. Bladder is decompressed. No bladder stones identified. Stomach/Bowel: Stomach is within normal limits. Appendix appears normal. No evidence of bowel wall thickening, distention, or inflammatory changes. Vascular/Lymphatic: No significant vascular findings are present. No enlarged abdominal or pelvic lymph nodes. Reproductive: Uterus is enlarged with tiny calcifications suggesting fibroids. No abnormal adnexal masses. Other: No free air or free fluid in the abdomen. Minimal umbilical hernia containing fat. Musculoskeletal: No acute or significant osseous findings. IMPRESSION: 1. Left kidney is slightly enlarged and hypodense compared to the right with stranding. No hydronephrosis or hydroureter and  no stones identified. This could reflect inflammatory process such as pyelonephritis or it could be the sequela of a recently passed stone. 2. Uterine fibroids. 3. Minimal umbilical hernia containing fat. 4. No evidence of bowel obstruction or inflammation. Appendix is normal. Electronically Signed   By: Burman Nieves M.D.   On: 07/26/2017 22:10    Procedures Procedures (including critical care time)  Medications Ordered in ED Medications  sodium chloride 0.9 % bolus 500 mL (0 mLs Intravenous Stopped 07/26/17 2152)  ondansetron (ZOFRAN) injection 4 mg (4 mg Intravenous Given 07/26/17 2130)  morphine 4 MG/ML injection 4 mg (4 mg Intravenous Given 07/26/17 2130)     Initial Impression / Assessment and Plan / ED Course  I have reviewed the triage vital signs and the nursing notes.  Pertinent labs & imaging  results that were available during my care of the patient were reviewed by me and considered in my medical decision making (see chart for details).     Patient with suspected passed kidney stone.  CT renal stone study shows left kidney slightly enlarged and hypodense compared to right with stranding, no hydronephrosis or hydroureter and no stones identified, however could reflect inflammatory process such as pyelonephritis or sequela of recently passed stone.  UA shows large hematuria, RBCs, and amorphous crystal.  Urine culture sent.  Patient's not currently on her cycle and presentation most consistent with stone.  CBC shows leukocytosis, 15.7.  CMP unremarkable.  Urine pregnancy negative.  Will discharge home with pain control and Zofran with follow-up to urology.  I reviewed the [place] narcotic database and found no discrepancies.  Return precautions discussed.  Patient understands and agrees with plan.  Patient vitals stable throughout ED course and discharged in satisfactory condition.  Final Clinical Impressions(s) / ED Diagnoses   Final diagnoses:  Left flank pain    ED Discharge Orders        Ordered    HYDROcodone-acetaminophen (NORCO/VICODIN) 5-325 MG tablet  Every 6 hours PRN     07/26/17 2331    ibuprofen (ADVIL,MOTRIN) 800 MG tablet  3 times daily     07/26/17 2331    ondansetron (ZOFRAN) 4 MG tablet  Every 6 hours     07/26/17 2331       Emi Holes, PA-C 07/28/17 0040    Vanetta Mulders, MD 08/01/17 0025

## 2017-07-28 LAB — URINE CULTURE: Special Requests: NORMAL

## 2017-08-01 ENCOUNTER — Other Ambulatory Visit (HOSPITAL_COMMUNITY): Payer: Self-pay | Admitting: Nurse Practitioner

## 2017-08-01 DIAGNOSIS — R310 Gross hematuria: Secondary | ICD-10-CM

## 2017-08-07 ENCOUNTER — Encounter (HOSPITAL_COMMUNITY): Payer: Self-pay

## 2017-08-07 ENCOUNTER — Ambulatory Visit (HOSPITAL_COMMUNITY)
Admission: RE | Admit: 2017-08-07 | Discharge: 2017-08-07 | Disposition: A | Discharge status: Home / Self Care | Payer: BLUE CROSS/BLUE SHIELD | Source: Ambulatory Visit | Attending: Nurse Practitioner | Admitting: Nurse Practitioner

## 2017-08-10 ENCOUNTER — Ambulatory Visit: Payer: Managed Care, Other (non HMO) | Admitting: Family Medicine

## 2017-08-21 ENCOUNTER — Ambulatory Visit: Payer: Managed Care, Other (non HMO) | Admitting: Family Medicine

## 2017-08-21 NOTE — Progress Notes (Deleted)
  No chief complaint on file.   HPI  4 review of systems  Past Medical History:  Diagnosis Date  . Kidney stone     Current Outpatient Medications  Medication Sig Dispense Refill  . HYDROcodone-acetaminophen (NORCO/VICODIN) 5-325 MG tablet Take 1-2 tablets by mouth every 6 (six) hours as needed for severe pain. 8 tablet 0  . ibuprofen (ADVIL,MOTRIN) 800 MG tablet Take 1 tablet (800 mg total) by mouth 3 (three) times daily. 21 tablet 0  . ondansetron (ZOFRAN) 4 MG tablet Take 1 tablet (4 mg total) by mouth every 6 (six) hours. 12 tablet 0   No current facility-administered medications for this visit.     Allergies: No Known Allergies  No past surgical history on file.  Social History   Socioeconomic History  . Marital status: Single    Spouse name: Not on file  . Number of children: Not on file  . Years of education: Not on file  . Highest education level: Not on file  Social Needs  . Financial resource strain: Not on file  . Food insecurity - worry: Not on file  . Food insecurity - inability: Not on file  . Transportation needs - medical: Not on file  . Transportation needs - non-medical: Not on file  Occupational History  . Not on file  Tobacco Use  . Smoking status: Current Every Day Smoker    Packs/day: 0.30    Years: 17.00    Pack years: 5.10    Types: Cigarettes  . Smokeless tobacco: Never Used  Substance and Sexual Activity  . Alcohol use: No  . Drug use: No  . Sexual activity: Not on file  Other Topics Concern  . Not on file  Social History Narrative  . Not on file    No family history on file.   ROS Review of Systems See HPI Constitution: No fevers or chills No malaise No diaphoresis Skin: No rash or itching Eyes: no blurry vision, no double vision GU: no dysuria or hematuria Neuro: no dizziness or headaches * all others reviewed and negative   Objective: There were no vitals filed for this visit.  Physical Exam  Assessment and  Plan There are no diagnoses linked to this encounter.   Delores P PPL Corporationaddy

## 2024-04-26 ENCOUNTER — Telehealth: Admitting: Physician Assistant

## 2024-04-26 DIAGNOSIS — J029 Acute pharyngitis, unspecified: Secondary | ICD-10-CM

## 2024-04-26 DIAGNOSIS — H9209 Otalgia, unspecified ear: Secondary | ICD-10-CM

## 2024-04-26 NOTE — Progress Notes (Signed)
 Virtual Visit Consent   Meghan Ross, you are scheduled for a virtual visit with a [facility] provider today. Just as with appointments in the office, your consent must be obtained to participate. Your consent will be active for this visit and any virtual visit you may have with one of our providers in the next 365 days. If you have a MyChart account, a copy of this consent can be sent to you electronically.  As this is a virtual visit, video technology does not allow for your provider to perform a traditional examination. This may limit your provider's ability to fully assess your condition. If your provider identifies any concerns that need to be evaluated in person or the need to arrange testing (such as labs, EKG, etc.), we will make arrangements to do so. Although advances in technology are sophisticated, we cannot ensure that it will always work on either your end or our end. If the connection with a video visit is poor, the visit may have to be switched to a telephone visit. With either a video or telephone visit, we are not always able to ensure that we have a secure connection.  By engaging in this virtual visit, you consent to the provision of healthcare and authorize for your insurance to be billed (if applicable) for the services provided during this visit. Depending on your insurance coverage, you may receive a charge related to this service.  I need to obtain your verbal consent now. Are you willing to proceed with your visit today? Meghan Ross has provided verbal consent on 04/26/2024 for a virtual visit (video or telephone). Meghan Ross, [place]  Date: 04/26/2024 3:18 PM   Virtual Visit via Video Note   I, Meghan Ross, connected with  DAILYNN NANCARROW  ([id number], 06-13-1976) on 04/26/24 at  3:15 PM EDT by a video-enabled telemedicine application and verified that I am speaking with the correct person using two identifiers.  Location: Patient: Virtual Visit Location  Patient: Home Provider: Virtual Visit Location Provider: Home Office   I discussed the limitations of evaluation and management by telemedicine and the availability of in person appointments. The patient expressed understanding and agreed to proceed.    History of Present Illness: Meghan Ross is a 48 y.o. who identifies as a female who was assigned female at birth, and is being seen today for ear and throat pain.  HPI: URI  This is a new problem. The current episode started today. There has been no fever. Associated symptoms include ear pain, a sore throat and swollen glands. She has tried nothing for the symptoms. The treatment provided no relief.    Problems: There are no active problems to display for this patient.   Allergies: No Known Allergies Medications:  Current Outpatient Medications:    HYDROcodone -acetaminophen  (NORCO/VICODIN) 5-325 MG tablet, Take 1-2 tablets by mouth every 6 (six) hours as needed for severe pain., Disp: 8 tablet, Rfl: 0   ibuprofen  (ADVIL ,MOTRIN ) 800 MG tablet, Take 1 tablet (800 mg total) by mouth 3 (three) times daily., Disp: 21 tablet, Rfl: 0   ondansetron  (ZOFRAN ) 4 MG tablet, Take 1 tablet (4 mg total) by mouth every 6 (six) hours., Disp: 12 tablet, Rfl: 0  Observations/Objective: Patient is well-developed, well-nourished in no acute distress.  Resting comfortably  at home.  Head is normocephalic, atraumatic.  No labored breathing.  Speech is clear and coherent with logical content.  Patient is alert and oriented at baseline.    Assessment and  Plan: 1. Sore throat (Primary)  2. Otalgia, unspecified laterality  Patient presenting with sore throat and ear pain. Advised this could be symptoms of an early URI. She has only had symptoms for 24 hours and has no fever. No sick contacts, no cough or congestion. Patient then stated she is worried about an ear infection. I advised I am unable to do an ear exam via telehealth and that she would have to  present to the nearest Urgent care. Patient agreed to this plan. She is afebrile not toxic or ill appearing.   Follow Up Instructions: I discussed the assessment and treatment plan with the patient. The patient was provided an opportunity to ask questions and all were answered. The patient agreed with the plan and demonstrated an understanding of the instructions.  A copy of instructions were sent to the patient via MyChart unless otherwise noted below.     The patient was advised to call back or seek an in-person evaluation if the symptoms worsen or if the condition fails to improve as anticipated.    Meghan Shuck, PA-C

## 2024-04-26 NOTE — Patient Instructions (Signed)
  Meghan Ross, thank you for joining [name], PA-C for today's virtual visit.  While this provider is not your primary care provider (PCP), if your PCP is located in our provider database this encounter information will be shared with them immediately following your visit.   A [facility] MyChart account gives you access to today's visit and all your visits, tests, and labs performed at [facility]  click here if you don't have a [facility] MyChart account or go to [email or web address]  Consent: (Patient) Meghan Ross provided verbal consent for this virtual visit at the beginning of the encounter.  Current Medications:  Current Outpatient Medications:    HYDROcodone -acetaminophen  (NORCO/VICODIN) 5-325 MG tablet, Take 1-2 tablets by mouth every 6 (six) hours as needed for severe pain., Disp: 8 tablet, Rfl: 0   ibuprofen  (ADVIL ,MOTRIN ) 800 MG tablet, Take 1 tablet (800 mg total) by mouth 3 (three) times daily., Disp: 21 tablet, Rfl: 0   ondansetron  (ZOFRAN ) 4 MG tablet, Take 1 tablet (4 mg total) by mouth every 6 (six) hours., Disp: 12 tablet, Rfl: 0   Medications ordered in this encounter:  No orders of the defined types were placed in this encounter.    *If you need refills on other medications prior to your next appointment, please contact your pharmacy*  Follow-Up: Call back or seek an in-person evaluation if the symptoms worsen or if the condition fails to improve as anticipated.  [facility] [phone]  Other Instructions Report to urgent care for evaluation.    If you have been instructed to have an in-person evaluation today at a local Urgent Care facility, please use the link below. It will take you to a list of all of our available [facility], including address, phone number and hours of operation. Please do not delay care.  [facility]  If you or a family member do not have a primary care  provider, use the link below to schedule a visit and establish care. When you choose a [facility] primary care physician or advanced practice provider, you gain a long-term partner in health. Find a Primary Care Provider  Learn more about [facility]'s in-office and virtual care options: [facility] - Get Care Now

## 2024-07-10 ENCOUNTER — Other Ambulatory Visit: Payer: Self-pay

## 2024-07-10 ENCOUNTER — Emergency Department (HOSPITAL_BASED_OUTPATIENT_CLINIC_OR_DEPARTMENT_OTHER)
Admission: EM | Admit: 2024-07-10 | Discharge: 2024-07-10 | Disposition: A | Discharge status: Home / Self Care | Attending: Emergency Medicine | Admitting: Emergency Medicine

## 2024-07-10 ENCOUNTER — Emergency Department (HOSPITAL_BASED_OUTPATIENT_CLINIC_OR_DEPARTMENT_OTHER)

## 2024-07-10 ENCOUNTER — Encounter (HOSPITAL_BASED_OUTPATIENT_CLINIC_OR_DEPARTMENT_OTHER): Payer: Self-pay

## 2024-07-10 DIAGNOSIS — D72829 Elevated white blood cell count, unspecified: Secondary | ICD-10-CM | POA: Diagnosis not present

## 2024-07-10 DIAGNOSIS — R051 Acute cough: Secondary | ICD-10-CM | POA: Diagnosis present

## 2024-07-10 DIAGNOSIS — R0789 Other chest pain: Secondary | ICD-10-CM | POA: Diagnosis not present

## 2024-07-10 DIAGNOSIS — R509 Fever, unspecified: Secondary | ICD-10-CM | POA: Insufficient documentation

## 2024-07-10 DIAGNOSIS — R000 Tachycardia, unspecified: Secondary | ICD-10-CM | POA: Insufficient documentation

## 2024-07-10 DIAGNOSIS — R079 Chest pain, unspecified: Secondary | ICD-10-CM

## 2024-07-10 LAB — CBC WITH DIFFERENTIAL/PLATELET
Abs Immature Granulocytes: 0.03 K/uL (ref 0.00–0.07)
Basophils Absolute: 0.1 K/uL (ref 0.0–0.1)
Basophils Relative: 0 %
Eosinophils Absolute: 0.2 K/uL (ref 0.0–0.5)
Eosinophils Relative: 1 %
HCT: 46.1 % — ABNORMAL HIGH (ref 36.0–46.0)
Hemoglobin: 14.9 g/dL (ref 12.0–15.0)
Immature Granulocytes: 0 %
Lymphocytes Relative: 35 %
Lymphs Abs: 4.3 K/uL — ABNORMAL HIGH (ref 0.7–4.0)
MCH: 28.3 pg (ref 26.0–34.0)
MCHC: 32.3 g/dL (ref 30.0–36.0)
MCV: 87.6 fL (ref 80.0–100.0)
Monocytes Absolute: 0.6 K/uL (ref 0.1–1.0)
Monocytes Relative: 5 %
Neutro Abs: 7.3 K/uL (ref 1.7–7.7)
Neutrophils Relative %: 59 %
Platelets: 315 K/uL (ref 150–400)
RBC: 5.26 MIL/uL — ABNORMAL HIGH (ref 3.87–5.11)
RDW: 14.6 % (ref 11.5–15.5)
WBC: 11.8 K/uL — ABNORMAL HIGH (ref 4.0–10.5)
nRBC: 0 % (ref 0.0–0.2)

## 2024-07-10 LAB — BASIC METABOLIC PANEL WITH GFR
Anion gap: 13 (ref 5–15)
BUN: 9 mg/dL (ref 6–20)
CO2: 24 mmol/L (ref 22–32)
Calcium: 9.8 mg/dL (ref 8.9–10.3)
Chloride: 103 mmol/L (ref 98–111)
Creatinine, Ser: 0.78 mg/dL (ref 0.44–1.00)
GFR, Estimated: >60 mL/min
Glucose, Bld: 92 mg/dL (ref 70–99)
Potassium: 4.3 mmol/L (ref 3.5–5.1)
Sodium: 139 mmol/L (ref 135–145)

## 2024-07-10 LAB — RESP PANEL BY RT-PCR (RSV, FLU A&B, COVID)  RVPGX2
Influenza A by PCR: NEGATIVE
Influenza B by PCR: NEGATIVE
Resp Syncytial Virus by PCR: NEGATIVE
SARS Coronavirus 2 by RT PCR: NEGATIVE

## 2024-07-10 LAB — TROPONIN T, HIGH SENSITIVITY: Troponin T High Sensitivity: <15 ng/L (ref 0–19)

## 2024-07-10 MED ORDER — BENZONATATE 100 MG PO CAPS
100.0000 mg | ORAL_CAPSULE | Freq: Once | ORAL | Status: AC
  Administered 2024-07-10: 100 mg via ORAL
  Filled 2024-07-10: qty 1

## 2024-07-10 MED ORDER — BENZONATATE 100 MG PO CAPS: 100.0000 mg | ORAL_CAPSULE | Freq: Three times a day (TID) | ORAL | 0 refills | Status: AC

## 2024-07-10 NOTE — Discharge Instructions (Signed)
 It was a pleasure taking care of you today.  As discussed, your workup is reassuring.  Your heart rate remained elevated.  Please have a recheck by PCP this week.  I am sending you home with cough medication.  Take as needed.  Return to the ED for any new or worsening symptoms.

## 2024-07-10 NOTE — ED Triage Notes (Signed)
 Patient here POV from Home.  Endorses Cough for about 2 weeks. Cough is lingering, has noted congestion as well.   No Fever after about 1 week. Aches that have since subsided.   NAD noted during triage. A&Ox4. Gcs 15. Ambulatory.

## 2024-07-10 NOTE — ED Notes (Signed)
 Pt ambulated with pulse oximetry. HR stayed between 117-122 and O2 @ 98-99%. Pt denied shob or distress. Tolerated well. Once in bed, HR went back down to baseline @107 -109.

## 2024-07-10 NOTE — ED Provider Notes (Signed)
 " [place] [facility] Provider Note   CSN: [id number] Arrival date & time: 07/10/24  1341     Patient presents with: Cough   Meghan Ross is a 49 y.o. female with a past medical history significant for history of kidney stones who presents to the ED due to persistent cough x 2 weeks.  Patient had flulike symptoms 2 weeks ago at onset of cough.  Had intermittent fever, body aches, and malaise which has completely resolved.  Patient notes her cough has been persistent.  Also admits to some intermittent central chest pain.  Denies shortness of breath.  No history of blood clots.  Denies recent surgery, recent long immobilizations, and hormonal treatments.  No recent fever over the past week.  No cardiac history. Currently chest pain fever. Last had an episode of CP right after waking up this morning.   History obtained from patient and past medical records. No interpreter used during encounter.       Prior to Admission medications  Medication Sig Start Date End Date Taking? Authorizing Provider  benzonatate  (TESSALON ) 100 MG capsule Take 1 capsule (100 mg total) by mouth every 8 (eight) hours. 07/10/24  Yes Brooklyne Radke, Aleck BROCKS, PA-C  HYDROcodone -acetaminophen  (NORCO/VICODIN) 5-325 MG tablet Take 1-2 tablets by mouth every 6 (six) hours as needed for severe pain. 07/26/17   Law, Alexandra M, PA-C  ibuprofen  (ADVIL ,MOTRIN ) 800 MG tablet Take 1 tablet (800 mg total) by mouth 3 (three) times daily. 07/26/17   Law, Alexandra M, PA-C  ondansetron  (ZOFRAN ) 4 MG tablet Take 1 tablet (4 mg total) by mouth every 6 (six) hours. 07/26/17   Law, Alexandra M, PA-C    Allergies: Patient has no known allergies.    Review of Systems  Respiratory:  Positive for cough. Negative for shortness of breath.   Cardiovascular:  Positive for chest pain. Negative for leg swelling.    Updated Vital Signs BP (!) 159/106   Pulse (!) 103   Temp 99.2 F (37.3 C) (Oral)   Resp  18   SpO2 96%   Physical Exam Vitals and nursing note reviewed.  Constitutional:      General: She is not in acute distress.    Appearance: She is not ill-appearing.  HENT:     Head: Normocephalic.  Eyes:     Pupils: Pupils are equal, round, and reactive to light.  Cardiovascular:     Rate and Rhythm: Normal rate and regular rhythm.     Pulses: Normal pulses.     Heart sounds: Normal heart sounds. No murmur heard.    No friction rub. No gallop.  Pulmonary:     Effort: Pulmonary effort is normal.     Breath sounds: Normal breath sounds.     Comments: Respirations equal and unlabored, patient able to speak in full sentences, lungs clear to auscultation bilaterally Chest:     Comments: Some anterior chest wall tenderness without crepitus or deformity. Abdominal:     General: Abdomen is flat. There is no distension.     Palpations: Abdomen is soft.     Tenderness: There is no abdominal tenderness. There is no guarding or rebound.  Musculoskeletal:        General: Normal range of motion.     Cervical back: Neck supple.  Skin:    General: Skin is warm and dry.  Neurological:     General: No focal deficit present.     Mental Status: She is alert.  Psychiatric:        Mood and Affect: Mood normal.        Behavior: Behavior normal.     (all labs ordered are listed, but only abnormal results are displayed) Labs Reviewed  CBC WITH DIFFERENTIAL/PLATELET - Abnormal; Notable for the following components:      Result Value   WBC 11.8 (*)    RBC 5.26 (*)    HCT 46.1 (*)    Lymphs Abs 4.3 (*)    All other components within normal limits  RESP PANEL BY RT-PCR (RSV, FLU A&B, COVID)  RVPGX2  BASIC METABOLIC PANEL WITH GFR  TROPONIN T, HIGH SENSITIVITY    EKG: None  Radiology: DG Chest 2 View Result Date: 07/10/2024 CLINICAL DATA:  Cough 2 weeks. EXAM: CHEST - 2 VIEW COMPARISON:  03/22/2014 FINDINGS: The heart size and mediastinal contours are within normal limits. Both lungs  are clear. The visualized skeletal structures are unremarkable. IMPRESSION: No active cardiopulmonary disease. Electronically Signed   By: Toribio Agreste M.D.   On: 07/10/2024 15:03     Procedures   Medications Ordered in the ED  benzonatate  (TESSALON ) capsule 100 mg (100 mg Oral Given 07/10/24 1740)                                    Medical Decision Making Amount and/or Complexity of Data Reviewed External Data Reviewed: notes.    Details: UC note Labs: ordered. Decision-making details documented in ED Course. Radiology: ordered and independent interpretation performed. Decision-making details documented in ED Course. ECG/medicine tests: ordered and independent interpretation performed. Decision-making details documented in ED Course.  Risk Prescription drug management.   This patient presents to the ED for concern of cough + CP, this involves an extensive number of treatment options, and is a complaint that carries with it a high risk of complications and morbidity.  The differential diagnosis includes viral process, ACS, PE, aortic dissection  49 year old female presents to the ED due to persistent cough x 2 weeks.  Had URI symptoms 2 weeks ago with onset of cough.  Also admits to some intermittent chest pain.  No history of blood clots, recent surgeries and recent long immobilizations, or hormonal treatments.  Denies lower extremity edema.  Upon arrival patient afebrile and tachycardic at 118 with normal O2 saturation.  Patient notes her heart rate is typically elevated while at the doctor's office.  Reviewed previous urgent care note where patient had a heart rate of 110 in October 2025.  Heart rate improved while in the waiting room to 105.  Patient in no acute distress.  Patient well-appearing on exam.  Lungs clear to auscultation bilaterally.  Does have some reproducible anterior chest wall tenderness without crepitus or deformity.  No lower extremity edema.  No calf tenderness.   Cardiac labs ordered.  Patient is currently chest pain-free and has not had chest pain since this morning.  RVP and chest x-ray ordered.  CBC with leukocytosis at 11.8.  Normal hemoglobin.  BMP unremarkable.  Normal renal function.  No major electrolyte derangements.  Chest x-ray personally reviewed and interpreted which is negative for signs of pneumonia, pneumothorax, widened mediastinum.  EKG sinus tachycardia.  No signs of acute ischemia.  Troponin normal.  Low suspicion for ACS.   Patient ambulated and maintained O2 saturation above 98% on room air.  Heart rate did elevate.  Patient notes this is her baseline.  Patient has no evidence of DVT on exam.  Chest pain is reproducible on exam and worse with coughing.  Possible MSK etiology.  Lower suspicion for PE.  No evidence of respiratory distress.  Suspect prolonged cough likely related to a postviral cough.  Patient discharged with Tessalon  [place] and advised to take ibuprofen  as needed for chest pain.  Advised to follow-up with PCP for recheck this week.  Patient stable for discharge. Strict ED precautions discussed with patient. Patient states understanding and agrees to plan. Patient discharged home in no acute distress and stable vitals  Co morbidities that complicate the patient evaluation  Hx kidney stones Cardiac Monitoring: / EKG:  The patient was maintained on a cardiac monitor.  I personally viewed and interpreted the cardiac monitored which showed an underlying rhythm of: sinus tachycardia  Social Determinants of Health:  Tobacco abuse  Test / Admission - Considered:  Considered admission; however given reassuring workup feel patient stable for discharge.  Suspect cough likely postviral in nature.  Low suspicion for PE or aortic dissection.  Low suspicion for ACS.      Final diagnoses:  Acute cough  Nonspecific chest pain  Tachycardia    ED Discharge Orders          Ordered    benzonatate  (TESSALON ) 100 MG capsule   Every 8 hours        07/10/24 1855               Darivs Lunden C, PA-C 07/10/24 1901    Yolande Lamar BROCKS, MD 07/12/24 1442  "
